# Patient Record
Sex: Male | Born: 1990 | Race: White | Hispanic: No | Marital: Single | Smoking: Current every day smoker
Health system: Southern US, Community
[De-identification: ages and names within clinical notes are randomized; demographics above are authoritative.]

## PROBLEM LIST (undated history)

## (undated) DIAGNOSIS — M419 Scoliosis, unspecified: Secondary | ICD-10-CM

## (undated) DIAGNOSIS — G40909 Epilepsy, unspecified, not intractable, without status epilepticus: Secondary | ICD-10-CM

## (undated) HISTORY — PX: BACK SURGERY: SHX140

---

## 2012-03-23 ENCOUNTER — Ambulatory Visit (INDEPENDENT_AMBULATORY_CARE_PROVIDER_SITE_OTHER): Payer: BC Managed Care – PPO | Admitting: Family Medicine

## 2012-03-23 ENCOUNTER — Encounter: Payer: Self-pay | Admitting: Family Medicine

## 2012-03-23 VITALS — BP 124/70 | HR 88 | Temp 98.0°F | Ht 60.0 in | Wt 120.0 lb

## 2012-03-23 DIAGNOSIS — R69 Illness, unspecified: Secondary | ICD-10-CM

## 2012-03-23 NOTE — Patient Instructions (Addendum)
It was nice to meet you.  Please stop by to see Nicholas Allen on your way out. 

## 2012-03-23 NOTE — Progress Notes (Signed)
  Subjective:    Patient ID: Nicholas Allen, male    DOB: 03/08/1991, 21 y.o.   MRN: [id number]  HPI  21 yo male here to establish care and talk about depression.  He is here with his aunt today.  Has not been to a physician since he was a small child.  For most of his life, he has felt depressed and anxious. He was raised by his grandparents- his mother is bipolar and schizophrenia.  He endorses difficulty sleeping. He also gets angry- usually punches things- has never hurt anyone else. Has never cut himself.  He does endorse hearing and seeing things other people do not hear or see-like voices or people.  He does not want to go into detail about these hallucinations.  He has had episodes of staying up all night.  Does not currently have any SI or HI.  There is no problem list on file for this patient.  No past medical history on file. No past surgical history on file. History  Substance Use Topics  . Smoking status: Former Games developer  . Smokeless tobacco: Not on file  . Alcohol Use: Not on file   Family History  Problem Relation Age of Onset  . Schizophrenia Mother   . Bipolar disorder Mother   . Depression Maternal Aunt    No Known Allergies No current outpatient prescriptions on file prior to visit.   The PMH, PSH, Social History, Family History, Medications, and allergies have been reviewed in [facility], and have been updated if relevant.   Review of Systems See HPI    Objective:   Physical Exam BP 124/70  Pulse 88  Temp 98 F (36.7 C)  Ht 5' (1.524 m)  Wt 120 lb (54.432 kg)  BMI 23.44 kg/m2 Gen: alert, does make eye contact, pleasant Psych:  Anxious but cooperative.    Assessment & Plan:   1. Psychiatric diagnosis deferred  Ambulatory referral to Psychiatry   >25 min spent with face to face with patient, >50% counseling and/or coordinating care. Discussed importance of psychiatry referral as he may have bipolar disorder and or schizophrenia and needs to be  evaluated by a psychiatrist. Will place referral. The patient indicates understanding of these issues and agrees with the plan.

## 2012-06-01 ENCOUNTER — Ambulatory Visit: Payer: Self-pay | Admitting: Family Medicine

## 2012-09-13 ENCOUNTER — Emergency Department: Payer: Self-pay

## 2014-10-31 ENCOUNTER — Encounter (HOSPITAL_COMMUNITY): Payer: Self-pay

## 2014-10-31 ENCOUNTER — Emergency Department (HOSPITAL_COMMUNITY)
Admission: EM | Admit: 2014-10-31 | Discharge: 2014-10-31 | Discharge status: Against Medical Advice | Payer: BLUE CROSS/BLUE SHIELD | Attending: Emergency Medicine | Admitting: Emergency Medicine

## 2014-10-31 ENCOUNTER — Emergency Department: Payer: Self-pay | Admitting: Emergency Medicine

## 2014-10-31 DIAGNOSIS — F410 Panic disorder [episodic paroxysmal anxiety] without agoraphobia: Secondary | ICD-10-CM | POA: Diagnosis not present

## 2014-10-31 DIAGNOSIS — M419 Scoliosis, unspecified: Secondary | ICD-10-CM | POA: Diagnosis not present

## 2014-10-31 HISTORY — DX: Scoliosis, unspecified: M41.9

## 2014-10-31 HISTORY — DX: Epilepsy, unspecified, not intractable, without status epilepticus: G40.909

## 2014-10-31 LAB — COMPREHENSIVE METABOLIC PANEL
ALT: 27 U/L (ref 14–63)
Albumin: 4.2 g/dL (ref 3.4–5.0)
Alkaline Phosphatase: 91 U/L (ref 46–116)
Anion Gap: 6 — ABNORMAL LOW (ref 7–16)
BILIRUBIN TOTAL: 0.5 mg/dL (ref 0.2–1.0)
BUN: 14 mg/dL (ref 7–18)
CALCIUM: 8.9 mg/dL (ref 8.5–10.1)
CHLORIDE: 106 mmol/L (ref 98–107)
CO2: 29 mmol/L (ref 21–32)
CREATININE: 0.71 mg/dL (ref 0.60–1.30)
Glucose: 90 mg/dL (ref 65–99)
Osmolality: 281 (ref 275–301)
Potassium: 3.8 mmol/L (ref 3.5–5.1)
SGOT(AST): 32 U/L (ref 15–37)
Sodium: 141 mmol/L (ref 136–145)
Total Protein: 7.1 g/dL (ref 6.4–8.2)

## 2014-10-31 LAB — CBC
HCT: 43.9 % (ref 40.0–52.0)
HGB: 14.5 g/dL (ref 13.0–18.0)
MCH: 28.5 pg (ref 26.0–34.0)
MCHC: 32.9 g/dL (ref 32.0–36.0)
MCV: 87 fL (ref 80–100)
PLATELETS: 314 10*3/uL (ref 150–440)
RBC: 5.07 10*6/uL (ref 4.40–5.90)
RDW: 13.3 % (ref 11.5–14.5)
WBC: 8.8 10*3/uL (ref 3.8–10.6)

## 2014-10-31 LAB — TROPONIN I: Troponin-I: <0.02 ng/mL

## 2014-10-31 NOTE — ED Notes (Signed)
Patient reports he has been having "black outs" that last for several seconds for several months.  He has been seen at [facility] for the same.  Patient and his girlfriend report that these episodes occur after patient becomes "really worked up" with a panic attack.  Patient is currently showing no signs of distress.  Girlfriend is sitting on lap.

## 2014-10-31 NOTE — ED Notes (Signed)
Pt called from triage, no answer 

## 2014-10-31 NOTE — ED Notes (Signed)
Pt not in lobby.  

## 2015-08-07 ENCOUNTER — Encounter: Payer: Self-pay | Admitting: Emergency Medicine

## 2015-08-07 ENCOUNTER — Emergency Department
Admission: EM | Admit: 2015-08-07 | Discharge: 2015-08-07 | Disposition: A | Discharge status: Home / Self Care | Payer: BLUE CROSS/BLUE SHIELD | Attending: Student | Admitting: Student

## 2015-08-07 DIAGNOSIS — Z720 Tobacco use: Secondary | ICD-10-CM | POA: Insufficient documentation

## 2015-08-07 DIAGNOSIS — L237 Allergic contact dermatitis due to plants, except food: Secondary | ICD-10-CM

## 2015-08-07 MED ORDER — HYDROXYZINE HCL 25 MG PO TABS: 25.0000 mg | ORAL_TABLET | Freq: Four times a day (QID) | ORAL | Status: AC | PRN

## 2015-08-07 MED ORDER — METHYLPREDNISOLONE SODIUM SUCC 125 MG IJ SOLR
125.0000 mg | Freq: Once | INTRAMUSCULAR | Status: AC
  Administered 2015-08-07: 125 mg via INTRAMUSCULAR
  Filled 2015-08-07: qty 2

## 2015-08-07 NOTE — ED Provider Notes (Signed)
[facility] Emergency Department Provider Note  ____________________________________________  Time seen: Approximately 10:10 AM  I have reviewed the triage vital signs and the nursing notes.   HISTORY  Chief Complaint Allergic Reaction   HPI Nicholas Allen is a 24 y.o. male is here with complaint of exposure to poison ivy. Patient states that he was working with a tree that "went down" in the backyard. This occurred 2 days ago and yesterday he began having a red itchy rash which quickly progressed all over his body. This morning he woke up with left eye swollen and facial swelling. Patient continues to itch. He is taken over-the-counter "allergy medication" but is not exactly sure what is in this medication. Father is unable to get this information from his wife. Patient denies any pain but states that the itching is very bad at times.He denies any vision problems other than his face being swollen.   Past Medical History  Diagnosis Date  . Epilepsy (HCC)   . Scoliosis     There are no active problems to display for this patient.   Past Surgical History  Procedure Laterality Date  . Back surgery      Current Outpatient Rx  Name  Route  Sig  Dispense  Refill  . hydrOXYzine (ATARAX/VISTARIL) 25 MG tablet   Oral   Take 1 tablet (25 mg total) by mouth every 6 (six) hours as needed.   30 tablet   0   . ibuprofen (ADVIL,MOTRIN) 200 MG tablet   Oral   Take 400 mg by mouth every 6 (six) hours as needed for moderate pain.           Allergies Review of patient's allergies indicates no known allergies.  Family History  Problem Relation Age of Onset  . Schizophrenia Mother   . Bipolar disorder Mother   . Depression Maternal Aunt     Social History Social History  Substance Use Topics  . Smoking status: Current Every Day Smoker -- 0.50 packs/day  . Smokeless tobacco: None  . Alcohol Use: Yes     Comment: socially    Review of  Systems Constitutional: No fever/chills Eyes: No visual changes. ENT: No sore throat. Cardiovascular: Denies chest pain. Respiratory: Denies shortness of breath. Gastrointestinal:  No nausea, no vomiting.  Musculoskeletal: Negative for back pain. Skin: Positive for rash Neurological: Negative for headaches, focal weakness or numbness.  10-point ROS otherwise negative.  ____________________________________________   PHYSICAL EXAM:  VITAL SIGNS: ED Triage Vitals  Enc Vitals Group     BP 08/07/15 1002 127/79 mmHg     Pulse Rate 08/07/15 1002 93     Resp 08/07/15 1002 18     Temp 08/07/15 1002 98.2 F (36.8 C)     Temp Source 08/07/15 1002 Oral     SpO2 08/07/15 1002 100 %     Weight 08/07/15 1002 120 lb (54.432 kg)     Height 08/07/15 1002  (1.549 m)     Head Cir --      Peak Flow --      Pain Score --      Pain Loc --      Pain Edu? --      Excl. in GC? --     Constitutional: Alert and oriented. Well appearing and in no acute distress. Eyes: Conjunctivae are normal. PERRL. EOMI. Head: Atraumatic. Nose: No congestion/rhinnorhea. Neck: No stridor.   Cardiovascular: Normal rate, regular rhythm. Grossly normal heart sounds.  Good peripheral circulation. Respiratory: Normal respiratory effort.  No retractions. Lungs CTAB. Gastrointestinal: Soft and nontender. No distention. Musculoskeletal: No lower extremity tenderness nor edema.  No joint effusions. Neurologic:  Normal speech and language. No gross focal neurologic deficits are appreciated. No gait instability. Skin:  Skin is warm, dry and intact. There is moderate amount of rash which consist of vesicles, erythema spread diffusely over the torso and upper extremities. Moderate erythema with vesicles left facial area. Upper lid is swollen and full view of the sclera area. Eyelid was raised and sclera are completely clear. Psychiatric: Mood and affect are normal. Speech and behavior are  normal.  ____________________________________________   LABS (all labs ordered are listed, but only abnormal results are displayed)  Labs Reviewed - No data to display  PROCEDURES  Procedure(s) performed: None  Critical Care performed: No  ____________________________________________   INITIAL IMPRESSION / ASSESSMENT AND PLAN / ED COURSE  Pertinent labs & imaging results that were available during Nicholas care of the patient were reviewed by me and considered in Nicholas medical decision making (see chart for details).  Patient was given Solu-Medrol 125 mg IM while in the emergency room. He is also given a prescription for Atarax 25 mg 1 every 6 hours when necessary itching. He was told he also could use cortisone or Benadryl cream to areas that are itching. He is to avoid scratching to prevent skin infection. ____________________________________________   FINAL CLINICAL IMPRESSION(S) / ED DIAGNOSES  Final diagnoses:  Contact dermatitis due to poison ivy      Tommi Rumpshonda L Mahad Newstrom, PA-C 08/07/15 1325  Gayla DossEryka A Gayle, MD 08/07/15 1626

## 2015-08-07 NOTE — Discharge Instructions (Signed)
Contact Dermatitis Dermatitis is redness, soreness, and swelling (inflammation) of the skin. Contact dermatitis is a reaction to certain substances that touch the skin. You either touched something that irritated your skin, or you have allergies to something you touched.  HOME CARE  Skin Care  Moisturize your skin as needed.  Apply cool compresses to the affected areas.   Try taking a bath with:   Epsom salts. Follow the instructions on the package. You can get these at a pharmacy or grocery store.   Baking soda. Pour a small amount into the bath as told by your doctor.   Colloidal oatmeal. Follow the instructions on the package. You can get this at a pharmacy or grocery store.   Try applying baking soda paste to your skin. Stir water into baking soda until it looks like paste.  Do not scratch your skin.   Bathe less often.  Bathe in lukewarm water. Avoid using hot water.  Medicines  Take or apply over-the-counter and prescription medicines only as told by your doctor.   If you were prescribed an antibiotic medicine, take or apply your antibiotic as told by your doctor. Do not stop taking the antibiotic even if your condition starts to get better. General Instructions  Keep all follow-up visits as told by your doctor. This is important.   Avoid the substance that caused your reaction. If you do not know what caused it, keep a journal to try to track what caused it. Write down:   What you eat.   What cosmetic products you use.   What you drink.   What you wear in the affected area. This includes jewelry.   If you were given a bandage (dressing), take care of it as told by your doctor. This includes when to change and remove it.  GET HELP IF:   You do not get better with treatment.   Your condition gets worse.   You have signs of infection such as:  Swelling.  Tenderness.  Redness.  Soreness.  Warmth.   You have a fever.   You have new  symptoms.  GET HELP RIGHT AWAY IF:   You have a very bad headache.  You have neck pain.  Your neck is stiff.   You throw up (vomit).   You feel very sleepy.   You see red streaks coming from the affected area.   Your bone or joint underneath the affected area becomes painful after the skin has healed.   The affected area turns darker.   You have trouble breathing.    This information is not intended to replace advice given to you by your health care provider. Make sure you discuss any questions you have with your health care provider.   Document Released: 07/11/2009 Document Revised: 06/04/2015 Document Reviewed: 01/29/2015 Elsevier Interactive Patient Education 2016 [employer]   Take Atarax as needed for itching every 6 hours.  Patient had a shot of Solu-Medrol 125 mg which should help tremendously with your poison oak. He may also use calamine lotion as needed for itching. Follow-up with your doctor if any continued problems or return to the emergency room if any urgent concerns.

## 2015-08-07 NOTE — ED Notes (Signed)
Pt here with left eye swelling that began yesterday. Pt states he was around poison ivy and thinks that might be the source of swelling. Rash noted to arms bilaterally, pt states the rash is "all over his body." Denies sob.

## 2015-12-28 ENCOUNTER — Emergency Department: Payer: No Typology Code available for payment source

## 2015-12-28 ENCOUNTER — Encounter: Payer: Self-pay | Admitting: Emergency Medicine

## 2015-12-28 ENCOUNTER — Emergency Department
Admission: EM | Admit: 2015-12-28 | Discharge: 2015-12-28 | Disposition: A | Discharge status: Home / Self Care | Payer: No Typology Code available for payment source | Attending: Student | Admitting: Student

## 2015-12-28 DIAGNOSIS — Y999 Unspecified external cause status: Secondary | ICD-10-CM | POA: Diagnosis not present

## 2015-12-28 DIAGNOSIS — Y9389 Activity, other specified: Secondary | ICD-10-CM | POA: Diagnosis not present

## 2015-12-28 DIAGNOSIS — F17200 Nicotine dependence, unspecified, uncomplicated: Secondary | ICD-10-CM | POA: Insufficient documentation

## 2015-12-28 DIAGNOSIS — Y92410 Unspecified street and highway as the place of occurrence of the external cause: Secondary | ICD-10-CM | POA: Diagnosis not present

## 2015-12-28 DIAGNOSIS — S62646A Nondisplaced fracture of proximal phalanx of right little finger, initial encounter for closed fracture: Secondary | ICD-10-CM | POA: Insufficient documentation

## 2015-12-28 DIAGNOSIS — M419 Scoliosis, unspecified: Secondary | ICD-10-CM | POA: Insufficient documentation

## 2015-12-28 DIAGNOSIS — G40909 Epilepsy, unspecified, not intractable, without status epilepticus: Secondary | ICD-10-CM | POA: Insufficient documentation

## 2015-12-28 DIAGNOSIS — S161XXA Strain of muscle, fascia and tendon at neck level, initial encounter: Secondary | ICD-10-CM | POA: Insufficient documentation

## 2015-12-28 DIAGNOSIS — S62629A Displaced fracture of medial phalanx of unspecified finger, initial encounter for closed fracture: Secondary | ICD-10-CM

## 2015-12-28 DIAGNOSIS — M79644 Pain in right finger(s): Secondary | ICD-10-CM | POA: Diagnosis present

## 2015-12-28 MED ORDER — TRAMADOL HCL 50 MG PO TABS: 50.0000 mg | ORAL_TABLET | Freq: Four times a day (QID) | ORAL | Status: DC | PRN

## 2015-12-28 MED ORDER — CYCLOBENZAPRINE HCL 5 MG PO TABS: 5.0000 mg | ORAL_TABLET | Freq: Three times a day (TID) | ORAL | Status: AC | PRN

## 2015-12-28 MED ORDER — ETODOLAC 500 MG PO TABS: 500.0000 mg | ORAL_TABLET | Freq: Two times a day (BID) | ORAL | Status: AC

## 2015-12-28 NOTE — ED Notes (Signed)
NAD noted at time of D/C. Pt denies questions or concerns. Pt ambulatory to the lobby at this time.  
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
## 2015-12-28 NOTE — Discharge Instructions (Signed)
Cervical Sprain A cervical sprain is an injury in the neck in which the strong, fibrous tissues (ligaments) that connect your neck bones stretch or tear. Cervical sprains can range from mild to severe. Severe cervical sprains can cause the neck vertebrae to be unstable. This can lead to damage of the spinal cord and can result in serious nervous system problems. The amount of time it takes for a cervical sprain to get better depends on the cause and extent of the injury. Most cervical sprains heal in 1 to 3 weeks. CAUSES  Severe cervical sprains may be caused by:   Contact sport injuries (such as from football, rugby, wrestling, hockey, auto racing, gymnastics, diving, martial arts, or boxing).   Motor vehicle collisions.   Whiplash injuries. This is an injury from a sudden forward and backward whipping movement of the head and neck.  Falls.  Mild cervical sprains may be caused by:   Being in an awkward position, such as while cradling a telephone between your ear and shoulder.   Sitting in a chair that does not offer proper support.   Working at a poorly Landscape [employer] station.   Looking up or down for long periods of time.  SYMPTOMS   Pain, soreness, stiffness, or a burning sensation in the front, back, or sides of the neck. This discomfort may develop immediately after the injury or slowly, 24 hours or more after the injury.   Pain or tenderness directly in the middle of the back of the neck.   Shoulder or upper back pain.   Limited ability to move the neck.   Headache.   Dizziness.   Weakness, numbness, or tingling in the hands or arms.   Muscle spasms.   Difficulty swallowing or chewing.   Tenderness and swelling of the neck.  DIAGNOSIS  Most of the time your health care provider can diagnose a cervical sprain by taking your history and doing a physical exam. Your health care provider will ask about previous neck injuries and any known neck  problems, such as arthritis in the neck. X-rays may be taken to find out if there are any other problems, such as with the bones of the neck. Other tests, such as a CT scan or MRI, may also be needed.  TREATMENT  Treatment depends on the severity of the cervical sprain. Mild sprains can be treated with rest, keeping the neck in place (immobilization), and pain medicines. Severe cervical sprains are immediately immobilized. Further treatment is done to help with pain, muscle spasms, and other symptoms and may include:  Medicines, such as pain relievers, numbing medicines, or muscle relaxants.   Physical therapy. This may involve stretching exercises, strengthening exercises, and posture training. Exercises and improved posture can help stabilize the neck, strengthen muscles, and help stop symptoms from returning.  HOME CARE INSTRUCTIONS   Put ice on the injured area.   Put ice in a plastic bag.   Place a towel between your skin and the bag.   Leave the ice on for 15-20 minutes, 3-4 times a day.   If your injury was severe, you may have been given a cervical collar to wear. A cervical collar is a two-piece collar designed to keep your neck from moving while it heals.  Do not remove the collar unless instructed by your health care provider.  If you have long hair, keep it outside of the collar.  Ask your health care provider before making any adjustments to your collar. Minor  adjustments may be required over time to improve comfort and reduce pressure on your chin or on the back of your head.  Ifyou are allowed to remove the collar for cleaning or bathing, follow your health care provider's instructions on how to do so safely.  Keep your collar clean by wiping it with mild soap and water and drying it completely. If the collar you have been given includes removable pads, remove them every 1-2 days and hand wash them with soap and water. Allow them to air dry. They should be completely  dry before you wear them in the collar.  If you are allowed to remove the collar for cleaning and bathing, wash and dry the skin of your neck. Check your skin for irritation or sores. If you see any, tell your health care provider.  Do not drive while wearing the collar.   Only take over-the-counter or prescription medicines for pain, discomfort, or fever as directed by your health care provider.   Keep all follow-up appointments as directed by your health care provider.   Keep all physical therapy appointments as directed by your health care provider.   Make any needed adjustments to your workstation to promote good posture.   Avoid positions and activities that make your symptoms worse.   Warm up and stretch before being active to help prevent problems.  SEEK MEDICAL CARE IF:   Your pain is not controlled with medicine.   You are unable to decrease your pain medicine over time as planned.   Your activity level is not improving as expected.  SEEK IMMEDIATE MEDICAL CARE IF:   You develop any bleeding.  You develop stomach upset.  You have signs of an allergic reaction to your medicine.   Your symptoms get worse.   You develop new, unexplained symptoms.   You have numbness, tingling, weakness, or paralysis in any part of your body.  MAKE SURE YOU:   Understand these instructions.  Will watch your condition.  Will get help right away if you are not doing well or get worse.   This information is not intended to replace advice given to you by your health care provider. Make sure you discuss any questions you have with your health care provider.   Document Released: 07/11/2007 Document Revised: 09/18/2013 Document Reviewed: 03/21/2013 Elsevier Interactive Patient Education 2016 [place].  Cryotherapy Cryotherapy means treatment with cold. Ice or gel packs can be used to reduce both pain and swelling. Ice is the most helpful within the first 24 to 48  hours after an injury or flare-up from overusing a muscle or joint. Sprains, strains, spasms, burning pain, shooting pain, and aches can all be eased with ice. Ice can also be used when recovering from surgery. Ice is effective, has very few side effects, and is safe for most people to use. PRECAUTIONS  Ice is not a safe treatment option for people with:  Raynaud phenomenon. This is a condition affecting small blood vessels in the extremities. Exposure to cold may cause your problems to return.  Cold hypersensitivity. There are many forms of cold hypersensitivity, including:  Cold urticaria. Red, itchy hives appear on the skin when the tissues begin to warm after being iced.  Cold erythema. This is a red, itchy rash caused by exposure to cold.  Cold hemoglobinuria. Red blood cells break down when the tissues begin to warm after being iced. The hemoglobin that carry oxygen are passed into the urine because they cannot combine with  blood proteins fast enough.  Numbness or altered sensitivity in the area being iced. If you have any of the following conditions, do not use ice until you have discussed cryotherapy with your caregiver:  Heart conditions, such as arrhythmia, angina, or chronic heart disease.  High blood pressure.  Healing wounds or open skin in the area being iced.  Current infections.  Rheumatoid arthritis.  Poor circulation.  Diabetes. Ice slows the blood flow in the region it is applied. This is beneficial when trying to stop inflamed tissues from spreading irritating chemicals to surrounding tissues. However, if you expose your skin to cold temperatures for too long or without the proper protection, you can damage your skin or nerves. Watch for signs of skin damage due to cold. HOME CARE INSTRUCTIONS Follow these tips to use ice and cold packs safely.  Place a dry or damp towel between the ice and skin. A damp towel will cool the skin more quickly, so you may need to  shorten the time that the ice is used.  For a more rapid response, add gentle compression to the ice.  Ice for no more than 10 to 20 minutes at a time. The bonier the area you are icing, the less time it will take to get the benefits of ice.  Check your skin after 5 minutes to make sure there are no signs of a poor response to cold or skin damage.  Rest 20 minutes or more between uses.  Once your skin is numb, you can end your treatment. You can test numbness by very lightly touching your skin. The touch should be so light that you do not see the skin dimple from the pressure of your fingertip. When using ice, most people will feel these normal sensations in this order: cold, burning, aching, and numbness.  Do not use ice on someone who cannot communicate their responses to pain, such as small children or people with dementia. HOW TO MAKE AN ICE PACK Ice packs are the most common way to use ice therapy. Other methods include ice massage, ice baths, and cryosprays. Muscle creams that cause a cold, tingly feeling do not offer the same benefits that ice offers and should not be used as a substitute unless recommended by your caregiver. To make an ice pack, do one of the following:  Place crushed ice or a bag of frozen vegetables in a sealable plastic bag. Squeeze out the excess air. Place this bag inside another plastic bag. Slide the bag into a pillowcase or place a damp towel between your skin and the bag.  Mix 3 parts water with 1 part rubbing alcohol. Freeze the mixture in a sealable plastic bag. When you remove the mixture from the freezer, it will be slushy. Squeeze out the excess air. Place this bag inside another plastic bag. Slide the bag into a pillowcase or place a damp towel between your skin and the bag. SEEK MEDICAL CARE IF:  You develop white spots on your skin. This may give the skin a blotchy (mottled) appearance.  Your skin turns blue or pale.  Your skin becomes waxy or  hard.  Your swelling gets worse. MAKE SURE YOU:   Understand these instructions.  Will watch your condition.  Will get help right away if you are not doing well or get worse.   This information is not intended to replace advice given to you by your health care provider. Make sure you discuss any questions you have with  your health care provider.   Document Released: 05/10/2011 Document Revised: 10/04/2014 Document Reviewed: 05/10/2011 Elsevier Interactive Patient Education 2016 Else[employer] Inc[employer]  Finger Fracture Finger fractures are breaks in the bones of the fingers. There are many types of fractures. There are also different ways of treating these fractures. Your doctor will talk with you about the best way to treat your fracture. Injury is the main cause of broken fingers. This includes:  Injuries while playing sports.  Workplace injuries.  Falls. HOME CARE  Follow your doctor's instructions for:  Activities.  Exercises.  Physical therapy.  Take medicines only as told by your doctor for pain, discomfort, or fever. GET HELP IF: You have pain or swelling that limits:  The motion of your fingers.  The use of your fingers. GET HELP RIGHT AWAY IF:  You cannot feel your fingers, or your fingers become numb.   This information is not intended to replace advice given to you by your health care provider. Make sure you discuss any questions you have with your health care provider.   Document Released: 03/01/2008 Document Revised: 10/04/2014 Document Reviewed: 04/25/2013 Elsevier Interactive Patient Education [employer] Else[employer] Inc[employer]

## 2017-11-30 ENCOUNTER — Emergency Department
Admission: EM | Admit: 2017-11-30 | Discharge: 2017-11-30 | Disposition: A | Discharge status: Home / Self Care | Payer: 59 | Attending: Emergency Medicine | Admitting: Emergency Medicine

## 2017-11-30 ENCOUNTER — Encounter: Payer: Self-pay | Admitting: *Deleted

## 2017-11-30 ENCOUNTER — Other Ambulatory Visit: Payer: Self-pay

## 2017-11-30 DIAGNOSIS — G43909 Migraine, unspecified, not intractable, without status migrainosus: Secondary | ICD-10-CM | POA: Diagnosis not present

## 2017-11-30 DIAGNOSIS — Z79899 Other long term (current) drug therapy: Secondary | ICD-10-CM | POA: Diagnosis not present

## 2017-11-30 DIAGNOSIS — R531 Weakness: Secondary | ICD-10-CM | POA: Insufficient documentation

## 2017-11-30 DIAGNOSIS — R5383 Other fatigue: Secondary | ICD-10-CM | POA: Insufficient documentation

## 2017-11-30 DIAGNOSIS — R51 Headache: Secondary | ICD-10-CM | POA: Diagnosis present

## 2017-11-30 DIAGNOSIS — F17200 Nicotine dependence, unspecified, uncomplicated: Secondary | ICD-10-CM | POA: Insufficient documentation

## 2017-11-30 LAB — COMPREHENSIVE METABOLIC PANEL
ALBUMIN: 4.3 g/dL (ref 3.5–5.0)
ALK PHOS: 90 U/L (ref 38–126)
ALT: 22 U/L (ref 17–63)
ANION GAP: 9 (ref 5–15)
AST: 37 U/L (ref 15–41)
BILIRUBIN TOTAL: 0.6 mg/dL (ref 0.3–1.2)
BUN: 10 mg/dL (ref 6–20)
CO2: 26 mmol/L (ref 22–32)
Calcium: 9 mg/dL (ref 8.9–10.3)
Chloride: 104 mmol/L (ref 101–111)
Creatinine, Ser: 0.59 mg/dL — ABNORMAL LOW (ref 0.61–1.24)
GFR calc non Af Amer: >60 mL/min (ref >60)
Glucose, Bld: 90 mg/dL (ref 65–99)
POTASSIUM: 3.9 mmol/L (ref 3.5–5.1)
Sodium: 139 mmol/L (ref 135–145)
TOTAL PROTEIN: 7.5 g/dL (ref 6.5–8.1)

## 2017-11-30 LAB — CBC
HCT: 42.2 % (ref 40.0–52.0)
HEMOGLOBIN: 14.9 g/dL (ref 13.0–18.0)
MCH: 30.2 pg (ref 26.0–34.0)
MCHC: 35.2 g/dL (ref 32.0–36.0)
MCV: 85.6 fL (ref 80.0–100.0)
Platelets: 362 10*3/uL (ref 150–440)
RBC: 4.93 MIL/uL (ref 4.40–5.90)
RDW: 13.5 % (ref 11.5–14.5)
WBC: 7.8 10*3/uL (ref 3.8–10.6)

## 2017-11-30 LAB — TROPONIN I

## 2017-11-30 MED ORDER — METOCLOPRAMIDE HCL 5 MG/ML IJ SOLN
10.0000 mg | Freq: Once | INTRAMUSCULAR | Status: AC
  Administered 2017-11-30: 10 mg via INTRAVENOUS
  Filled 2017-11-30: qty 2

## 2017-11-30 MED ORDER — SODIUM CHLORIDE 0.9 % IV BOLUS (SEPSIS)
1000.0000 mL | Freq: Once | INTRAVENOUS | Status: AC
  Administered 2017-11-30: 1000 mL via INTRAVENOUS

## 2017-11-30 MED ORDER — KETOROLAC TROMETHAMINE 30 MG/ML IJ SOLN
30.0000 mg | Freq: Once | INTRAMUSCULAR | Status: AC
  Administered 2017-11-30: 30 mg via INTRAVENOUS
  Filled 2017-11-30: qty 1

## 2017-11-30 MED ORDER — ONDANSETRON HCL 4 MG/2ML IJ SOLN
4.0000 mg | Freq: Once | INTRAMUSCULAR | Status: AC
  Administered 2017-11-30: 4 mg via INTRAVENOUS
  Filled 2017-11-30: qty 2

## 2017-11-30 NOTE — ED Provider Notes (Addendum)
[facility] Emergency Department Provider Note  Time seen: 8:21 PM  I have reviewed the triage vital signs and the nursing notes.   HISTORY  Chief Complaint Headache    HPI Nicholas Allen is a 27 y.o. male with a past medical history of migraines who presents to the emergency department for generalized fatigue/weakness as well as a headache.  According to the patient for the past 1-1.5 weeks he has been experiencing generalized fatigue weakness and a fairly constant moderate headache which he describes as a 5/10 dull aching headache.  Patient states a history of migraine headaches although he is not been formally diagnosed.  Denies any chest pain abdominal pain fever cough congestion nausea vomiting or diarrhea.  Denies dysuria.  Patient states he just feels fatigued tired and a generalized sensation of weakness.   Past Medical History:  Diagnosis Date  . Epilepsy (HCC)   . Scoliosis     There are no active problems to display for this patient.   Past Surgical History:  Procedure Laterality Date  . BACK SURGERY      Prior to Admission medications   Medication Sig Start Date End Date Taking? Authorizing Provider  cyclobenzaprine (FLEXERIL) 5 MG tablet Take 1 tablet (5 mg total) by mouth every 8 (eight) hours as needed for muscle spasms. 12/28/15   Evon Slack, PA-C  etodolac (LODINE) 500 MG tablet Take 1 tablet (500 mg total) by mouth 2 (two) times daily. 12/28/15   Evon Slack, PA-C  hydrOXYzine (ATARAX/VISTARIL) 25 MG tablet Take 1 tablet (25 mg total) by mouth every 6 (six) hours as needed. 08/07/15   Tommi Rumps, PA-C  ibuprofen (ADVIL,MOTRIN) 200 MG tablet Take 400 mg by mouth every 6 (six) hours as needed for moderate pain.    [provider]  traMADol (ULTRAM) 50 MG tablet Take 1 tablet (50 mg total) by mouth every 6 (six) hours as needed. 12/28/15   Evon Slack, PA-C    No Known Allergies  Family History  Problem  Relation Age of Onset  . Schizophrenia Mother   . Bipolar disorder Mother   . Depression Maternal Aunt     Social History Social History   Tobacco Use  . Smoking status: Current Every Day Smoker    Packs/day: 1.00  . Smokeless tobacco: Never Used  Substance Use Topics  . Alcohol use: Yes    Comment: socially  . Drug use: No    Review of Systems Constitutional: Negative for fever.  Positive for generalized fatigue/weakness Eyes: Negative for visual complaints ENT: Negative for recent illness/congestion Cardiovascular: Negative for chest pain. Respiratory: Negative for shortness of breath. Gastrointestinal: Negative for abdominal pain, vomiting and diarrhea. Genitourinary: Negative for urinary compaints Musculoskeletal: Negative for musculoskeletal complaints Skin: Negative for skin complaints  Neurological: Moderate headache.  Denies focal weakness or numbness. All other ROS negative  ____________________________________________   PHYSICAL EXAM:  VITAL SIGNS: ED Triage Vitals  Enc Vitals Group     BP 11/30/17 1920 138/87     Pulse Rate 11/30/17 1920 95     Resp 11/30/17 1920 20     Temp 11/30/17 1920 98.7 F (37.1 C)     Temp Source 11/30/17 1920 Oral     SpO2 11/30/17 1920 98 %     Weight 11/30/17 1921 130 lb (59 kg)     Height 11/30/17 1921 5\' 1"  (1.549 m)     Head Circumference --  Peak Flow --      Pain Score 11/30/17 1921 5     Pain Loc --      Pain Edu? --      Excl. in [place]? --    Constitutional: Alert and oriented. Well appearing and in no distress. Eyes: Normal exam, mild photophobia ENT   Head: Normocephalic and atraumatic.   Mouth/Throat: Mucous membranes are moist. Cardiovascular: Normal rate, regular rhythm. No murmur Respiratory: Normal respiratory effort without tachypnea nor retractions. Breath sounds are clear  Gastrointestinal: Soft and nontender. No distention.  Musculoskeletal: Nontender with normal range of motion in all  extremities. Neurologic:  Normal speech and language. No gross focal neurologic deficits.  Equal grip strength.  No pronator drift.  5/5 motor.  Cranial nerves intact. Skin:  Skin is warm, dry and intact.  Psychiatric: Mood and affect are normal.   ____________________________________________   INITIAL IMPRESSION / ASSESSMENT AND PLAN / ED COURSE  Pertinent labs & imaging results that were available during my care of the patient were reviewed by me and considered in my medical decision making (see chart for details).  She presents to the emergency department for generalized fatigue/weakness and a moderate headache.  Differential would include migraine, electrolyte/metabolic abnormality, dehydration, viral illness.  We will check labs, treat the patient's headache, IV hydrate and closely monitor.  Patient agreeable to this plan of care.  Labs are normal.  Patient states he feels much better after medications.  We will discharge home.  I discussed plenty of sleep, plenty of fluids and PCP follow-up.  ____________________________________________   FINAL CLINICAL IMPRESSION(S) / ED DIAGNOSES  Generalized fatigue/weakness Migraine headache    Minna AntisPaduchowski, Bracy Pepper, MD 11/30/17 2024    Minna AntisPaduchowski, Toriann Spadoni, MD 11/30/17 2125

## 2017-11-30 NOTE — ED Triage Notes (Signed)
Pt reports dizziness for 1.5 weeks. Pt reports a headache and states it feels like a migraine.  Pt alert.  Speech clear.  No n/v/d.

## 2019-09-30 ENCOUNTER — Emergency Department
Admission: EM | Admit: 2019-09-30 | Discharge: 2019-09-30 | Disposition: A | Discharge status: Home / Self Care | Payer: 59 | Attending: Emergency Medicine | Admitting: Emergency Medicine

## 2019-09-30 ENCOUNTER — Other Ambulatory Visit: Payer: Self-pay

## 2019-09-30 DIAGNOSIS — Z79899 Other long term (current) drug therapy: Secondary | ICD-10-CM | POA: Insufficient documentation

## 2019-09-30 DIAGNOSIS — F17210 Nicotine dependence, cigarettes, uncomplicated: Secondary | ICD-10-CM | POA: Insufficient documentation

## 2019-09-30 DIAGNOSIS — L03012 Cellulitis of left finger: Secondary | ICD-10-CM

## 2019-09-30 NOTE — Discharge Instructions (Signed)
You have had your cuticle abscess opened and drained. Keep the wound clean, dry, and covered. Follow-up with Dr. Dayton Martes as needed.

## 2019-09-30 NOTE — ED Notes (Signed)
Pt in with thumb pain - states he tried to pull a hang nail and now swollen and red with a little bruising as well.

## 2019-09-30 NOTE — ED Triage Notes (Signed)
Pt states he woke up with pain and swelling to the left thumb yesterday and it is worsening. Denies injury, swelling at the cuticle

## 2019-09-30 NOTE — ED Provider Notes (Signed)
[facility] Emergency Department Provider Note ____________________________________________  Time seen: 1140  I have reviewed the triage vital signs and the nursing notes.  HISTORY  Chief Complaint  Hand Pain (thumb)  HPI Nicholas Allen is a 29 y.o. male presents himself to the ED for evaluation of acute pain and swelling to the left thumb.  Patient describes pulling the cuticle hangnail away, before he began to experience swelling and pus collection to the cuticle.  He denies any other injury at this time.  Denies any fevers, chills, or sweats.   Past Medical History:  Diagnosis Date  . Epilepsy ([place])   . Scoliosis     There are no problems to display for this patient.   Past Surgical History:  Procedure Laterality Date  . BACK SURGERY      Prior to Admission medications   Medication Sig Start Date End Date Taking? Authorizing Provider  cyclobenzaprine (FLEXERIL) 5 MG tablet Take 1 tablet (5 mg total) by mouth every 8 (eight) hours as needed for muscle spasms. 12/28/15   Duanne Guess, PA-C  etodolac (LODINE) 500 MG tablet Take 1 tablet (500 mg total) by mouth 2 (two) times daily. 12/28/15   Duanne Guess, PA-C  hydrOXYzine (ATARAX/VISTARIL) 25 MG tablet Take 1 tablet (25 mg total) by mouth every 6 (six) hours as needed. 08/07/15   Johnn Hai, PA-C  ibuprofen (ADVIL,MOTRIN) 200 MG tablet Take 400 mg by mouth every 6 (six) hours as needed for moderate pain.    [provider]  traMADol (ULTRAM) 50 MG tablet Take 1 tablet (50 mg total) by mouth every 6 (six) hours as needed. 12/28/15   Duanne Guess, PA-C    Allergies Patient has no known allergies.  Family History  Problem Relation Age of Onset  . Schizophrenia Mother   . Bipolar disorder Mother   . Depression Maternal Aunt     Social History Social History   Tobacco Use  . Smoking status: Current Every Day Smoker    Packs/day: 1.00  . Smokeless tobacco: Never Used   Substance Use Topics  . Alcohol use: Yes    Comment: socially  . Drug use: No    Review of Systems  Constitutional: Negative for fever. Cardiovascular: Negative for chest pain. Respiratory: Negative for shortness of breath. Musculoskeletal: Negative for back pain. Skin: Negative for rash.  Infected cuticle of the left thumb as above. Neurological: Negative for headaches, focal weakness or numbness. ____________________________________________  PHYSICAL EXAM:  VITAL SIGNS: ED Triage Vitals  Enc Vitals Group     BP 09/30/19 1009 137/79     Pulse Rate 09/30/19 1009 78     Resp 09/30/19 1009 16     Temp 09/30/19 1009 97.9 F (36.6 C)     Temp Source 09/30/19 1009 Oral     SpO2 09/30/19 1009 99 %     Weight 09/30/19 1011 145 lb (65.8 kg)     Height 09/30/19 1011 5\' 1"  (1.549 m)     Head Circumference --      Peak Flow --      Pain Score 09/30/19 1010 5     Pain Loc --      Pain Edu? --      Excl. in [place]? --     Constitutional: Alert and oriented. Well appearing and in no distress. Head: Normocephalic and atraumatic. Eyes: Conjunctivae are normal. Normal extraocular movements Cardiovascular: Normal rate, regular rhythm. Normal distal pulses. Respiratory: Normal  respiratory effort. Musculoskeletal: Nontender with normal range of motion in all extremities.  Neurologic:  Normal gross sensation. Normal speech and language. No gross focal neurologic deficits are appreciated. Skin:  Skin is warm, dry and intact. No rash noted. Left thumb with a local collection of pus at the medial cuticle ____________________________________________  PROCEDURES  .Marland KitchenIncision and Drainage  Date/Time: 09/30/2019 11:35 AM Performed by: Lissa Hoard, PA-C Authorized by: Lissa Hoard, PA-C   Consent:    Consent obtained:  Verbal   Consent given by:  Patient   Risks discussed:  Bleeding and pain Location:    Indications for incision and drainage: paronychia.   Location:   Upper extremity   Upper extremity location:  Finger   Finger location:  L thumb Pre-procedure details:    Procedure prep: alcohol. Anesthesia (see MAR for exact dosages):    Anesthesia method:  None Procedure type:    Complexity:  Simple Procedure details:    Incision types:  Stab incision   Incision depth:  Dermal   Scalpel size: 18G needle bevel.   Wound management:  Irrigated with saline   Drainage:  Purulent   Drainage amount:  Moderate   Packing materials:  None Post-procedure details:    Patient tolerance of procedure:  Tolerated well, no immediate complications   ____________________________________________  INITIAL IMPRESSION / ASSESSMENT AND PLAN / ED COURSE  Patient with ED evaluation of a paronychia develop to the left thumb.  He presents due to pain and swelling.  The local abscess formation is opened and drained using 18-gauge needle bevel.  Patient tolerated procedure well and his wound is covered with a sterile, disposable bandage.  Wound care directions are provided.  Patient will follow with primary provider return to the ED as needed.  Nicholas Allen was evaluated in Emergency Department on 09/30/2019 for the symptoms described in the history of present illness. He was evaluated in the context of the global COVID-19 pandemic, which necessitated consideration that the patient might be at risk for infection with the SARS-CoV-2 virus that causes COVID-19. Institutional protocols and algorithms that pertain to the evaluation of patients at risk for COVID-19 are in a state of rapid change based on information released by regulatory bodies including the CDC and federal and state organizations. These policies and algorithms were followed during the patient's care in the ED. ____________________________________________  FINAL CLINICAL IMPRESSION(S) / ED DIAGNOSES  Final diagnoses:  Paronychia of left thumb      Karmen Stabs, Charlesetta Ivory, PA-C 09/30/19 1147    Dionne Bucy, MD 09/30/19 1314

## 2020-03-19 ENCOUNTER — Telehealth: Payer: Self-pay | Admitting: General Practice

## 2020-03-19 NOTE — Telephone Encounter (Signed)
Removed Dr. Aron as patient's PCP due to her leaving this practice and patient has not been seen by her in 5 years.  

## 2020-04-27 ENCOUNTER — Encounter: Payer: Self-pay | Admitting: Emergency Medicine

## 2020-04-27 ENCOUNTER — Emergency Department: Payer: Self-pay

## 2020-04-27 ENCOUNTER — Emergency Department
Admission: EM | Admit: 2020-04-27 | Discharge: 2020-04-27 | Disposition: A | Discharge status: Home / Self Care | Payer: Self-pay | Attending: Emergency Medicine | Admitting: Emergency Medicine

## 2020-04-27 ENCOUNTER — Other Ambulatory Visit: Payer: Self-pay

## 2020-04-27 DIAGNOSIS — F17200 Nicotine dependence, unspecified, uncomplicated: Secondary | ICD-10-CM | POA: Insufficient documentation

## 2020-04-27 DIAGNOSIS — M5441 Lumbago with sciatica, right side: Secondary | ICD-10-CM | POA: Insufficient documentation

## 2020-04-27 MED ORDER — KETOROLAC TROMETHAMINE 30 MG/ML IJ SOLN
30.0000 mg | Freq: Once | INTRAMUSCULAR | Status: AC
  Administered 2020-04-27: 30 mg via INTRAMUSCULAR
  Filled 2020-04-27: qty 1

## 2020-04-27 MED ORDER — BACLOFEN 5 MG PO TABS: 5.0000 mg | ORAL_TABLET | Freq: Three times a day (TID) | ORAL | 0 refills | Status: AC | PRN

## 2020-04-27 MED ORDER — IBUPROFEN 600 MG PO TABS: 600.0000 mg | ORAL_TABLET | Freq: Four times a day (QID) | ORAL | 0 refills | Status: DC | PRN

## 2020-04-27 MED ORDER — LIDOCAINE 5 % EX PTCH
1.0000 | MEDICATED_PATCH | CUTANEOUS | Status: DC
  Administered 2020-04-27: 1 via TRANSDERMAL
  Filled 2020-04-27: qty 1

## 2020-04-27 MED ORDER — LIDOCAINE 5 % EX PTCH: 1.0000 | MEDICATED_PATCH | CUTANEOUS | 0 refills | Status: AC

## 2020-04-27 NOTE — ED Notes (Signed)
See this RN triage note. Pt c/o hx of back problems and back pain. States pain to lower back and could not stand the pain today. Pt ambulatory from triage wait to treatment room.

## 2020-04-27 NOTE — ED Triage Notes (Signed)
Pt presents to ED ambulatory with steady gait, pt c/o lower back pain, pt states hx of back problems but today the pain was unbearable. Pt A&O x4, ambulatory without difficulty.

## 2020-04-27 NOTE — ED Provider Notes (Signed)
[facility] Emergency Department Provider Note  ____________________________________________  Time seen: Approximately 6:16 PM  I have reviewed the triage vital signs and the nursing notes.   HISTORY  Chief Complaint Back Pain    HPI Nicholas Allen is a 29 y.o. male that presents to the emergency department for evaluation of acute on chronic low back pain.  Patient states that pain radiates down the back of his right leg occasionally.  Pain has been worse than usual recently.  He does lift heavy items.  No specific trauma.  No bowel or bladder dysfunction or saddle anesthesias.  He has taken Tylenol for pain.  No fever, abdominal pain, numbness, tingling, urinary symptoms.  Past Medical History:  Diagnosis Date  . Epilepsy (HCC)   . Scoliosis     There are no problems to display for this patient.   Past Surgical History:  Procedure Laterality Date  . BACK SURGERY      Prior to Admission medications   Medication Sig Start Date End Date Taking? Authorizing Provider  Baclofen 5 MG TABS Take 5 mg by mouth 3 (three) times daily as needed. 04/27/20   Enid Derry, PA-C  cyclobenzaprine (FLEXERIL) 5 MG tablet Take 1 tablet (5 mg total) by mouth every 8 (eight) hours as needed for muscle spasms. 12/28/15   Evon Slack, PA-C  etodolac (LODINE) 500 MG tablet Take 1 tablet (500 mg total) by mouth 2 (two) times daily. 12/28/15   Evon Slack, PA-C  hydrOXYzine (ATARAX/VISTARIL) 25 MG tablet Take 1 tablet (25 mg total) by mouth every 6 (six) hours as needed. 08/07/15   Tommi Rumps, PA-C  ibuprofen (ADVIL) 600 MG tablet Take 1 tablet (600 mg total) by mouth every 6 (six) hours as needed. 04/27/20   Enid Derry, PA-C  lidocaine (LIDODERM) 5 % Place 1 patch onto the skin daily. Remove & Discard patch within 12 hours or as directed by MD 04/27/20   Enid Derry, PA-C  traMADol (ULTRAM) 50 MG tablet Take 1 tablet (50 mg total) by mouth every 6 (six) hours as  needed. 12/28/15   Evon Slack, PA-C    Allergies Patient has no known allergies.  Family History  Problem Relation Age of Onset  . Schizophrenia Mother   . Bipolar disorder Mother   . Depression Maternal Aunt     Social History Social History   Tobacco Use  . Smoking status: Current Every Day Smoker    Packs/day: 1.00  . Smokeless tobacco: Never Used  Substance Use Topics  . Alcohol use: Yes    Comment: socially  . Drug use: No     Review of Systems  Constitutional: No fever/chills Cardiovascular: No chest pain. Respiratory: No SOB. Gastrointestinal: No abdominal pain.  No nausea, no vomiting.  Musculoskeletal: Positive for back pain. Skin: Negative for rash, abrasions, lacerations, ecchymosis. Neurological: Negative for headaches, numbness or tingling   ____________________________________________   PHYSICAL EXAM:  VITAL SIGNS: ED Triage Vitals  Enc Vitals Group     BP 04/27/20 1616 (!) 132/73     Pulse Rate 04/27/20 1616 84     Resp 04/27/20 1616 20     Temp 04/27/20 1616 98.1 F (36.7 C)     Temp Source 04/27/20 1616 Oral     SpO2 04/27/20 1616 98 %     Weight 04/27/20 1617 128 lb (58.1 kg)     Height 04/27/20 1617 5\' 1"  (1.549 m)     Head Circumference --  Peak Flow --      Pain Score 04/27/20 1616 8     Pain Loc --      Pain Edu? --      Excl. in [place]? --      Constitutional: Alert and oriented. Well appearing and in no acute distress. Eyes: Conjunctivae are normal. PERRL. EOMI. Head: Atraumatic. ENT:      Ears:      Nose: No congestion/rhinnorhea.      Mouth/Throat: Mucous membranes are moist.  Neck: No stridor.  Cardiovascular: Normal rate, regular rhythm.  Good peripheral circulation. Respiratory: Normal respiratory effort without tachypnea or retractions. Lungs CTAB. Good air entry to the bases with no decreased or absent breath sounds. Gastrointestinal: Bowel sounds 4 quadrants. Soft and nontender to palpation. No guarding or  rigidity. No palpable masses. No distention.  Musculoskeletal: Full range of motion to all extremities. No gross deformities appreciated.  Tenderness to palpation throughout lumbar spine and right lumbar paraspinal muscles.  Strength equal in lower extremities bilaterally.  Normal gait and quick gait. Neurologic:  Normal speech and language. No gross focal neurologic deficits are appreciated.  Skin:  Skin is warm, dry and intact. No rash noted. Psychiatric: Mood and affect are normal. Speech and behavior are normal. Patient exhibits appropriate insight and judgement.   ____________________________________________   LABS (all labs ordered are listed, but only abnormal results are displayed)  Labs Reviewed - No data to display ____________________________________________  EKG   ____________________________________________  RADIOLOGY Lexine Baton, personally viewed and evaluated these images (plain radiographs) as part of my medical decision making, as well as reviewing the written report by the radiologist.  DG Lumbar Spine 2-3 Views  Result Date: 04/27/2020 CLINICAL DATA:  Back pain worsening today, history of surgery for scoliosis EXAM: LUMBAR SPINE - 2-3 VIEW COMPARISON:  Thoracic radiographs 09/13/2012 FINDINGS: Long segment thoracolumbar fusion hardware incompletely included within the level of imaging demonstrating fusion from the thoracic levels through L2 including bilateral transpedicular screws at L2 and a left transpedicular screw L1. No evidence of acute hardware complication or failure. Mild residual levocurvature of the thoracic levels. No acute fracture or vertebral body height loss. Normal bone mineralization. No worrisome osseous lesions. Included portions of bony pelvis are free of acute abnormality. IMPRESSION: 1. Long segment thoracolumbar fusion hardware without evidence of acute hardware complication or failure. 2. Mild residual levocurvature of the thoracic levels. 3.  No acute osseous abnormality. Electronically Signed   By: Kreg Shropshire M.D.   On: 04/27/2020 18:40    ____________________________________________    PROCEDURES  Procedure(s) performed:    Procedures    Medications  lidocaine (LIDODERM) 5 % 1 patch (1 patch Transdermal Patch Applied 04/27/20 1910)  ketorolac (TORADOL) 30 MG/ML injection 30 mg (30 mg Intramuscular Given 04/27/20 1911)     ____________________________________________   INITIAL IMPRESSION / ASSESSMENT AND PLAN / ED COURSE  Pertinent labs & imaging results that were available during my care of the patient were reviewed by me and considered in my medical decision making (see chart for details).  Review of the [place] CSRS was performed in accordance of the [facility] prior to dispensing any controlled drugs.   Patient presented to the emergency department for evaluation of low back pain.  Vital signs and exam are reassuring.  X-ray negative for acute bony abnormalities.  Patient will be discharged home with prescriptions for baclofen, motrin, lidoderm. Patient is to follow up with PCP or ortho as directed. Patient is given ED  precautions to return to the ED for any worsening or new symptoms.   Nicholas Allen was evaluated in Emergency Department on 04/27/2020 for the symptoms described in the history of present illness. He was evaluated in the context of the global COVID-19 pandemic, which necessitated consideration that the patient might be at risk for infection with the SARS-CoV-2 virus that causes COVID-19. Institutional protocols and algorithms that pertain to the evaluation of patients at risk for COVID-19 are in a state of rapid change based on information released by regulatory bodies including the CDC and federal and state organizations. These policies and algorithms were followed during the patient's care in the ED.  ____________________________________________  FINAL CLINICAL IMPRESSION(S) / ED DIAGNOSES  Final  diagnoses:  Acute right-sided low back pain with right-sided sciatica      NEW MEDICATIONS STARTED DURING THIS VISIT:  ED Discharge Orders         Ordered    ibuprofen (ADVIL) 600 MG tablet  Every 6 hours PRN     Discontinue  Reprint     04/27/20 1926    Baclofen 5 MG TABS  3 times daily PRN     Discontinue  Reprint     04/27/20 1926    lidocaine (LIDODERM) 5 %  Every 24 hours     Discontinue  Reprint     04/27/20 1926              This chart was dictated using voice recognition software/Dragon. Despite best efforts to proofread, errors can occur which can change the meaning. Any change was purely unintentional.    Enid Derry, PA-C 04/27/20 1941    Phineas Semen, MD 04/27/20 2036

## 2020-05-23 ENCOUNTER — Other Ambulatory Visit: Payer: Self-pay

## 2020-05-23 DIAGNOSIS — Z20822 Contact with and (suspected) exposure to covid-19: Secondary | ICD-10-CM

## 2020-05-25 LAB — NOVEL CORONAVIRUS, NAA: SARS-CoV-2, NAA: NOT DETECTED

## 2020-05-25 LAB — SARS-COV-2, NAA 2 DAY TAT

## 2021-03-16 ENCOUNTER — Other Ambulatory Visit: Payer: Self-pay

## 2021-03-16 ENCOUNTER — Encounter: Payer: Self-pay | Admitting: Emergency Medicine

## 2021-03-16 ENCOUNTER — Emergency Department
Admission: EM | Admit: 2021-03-16 | Discharge: 2021-03-16 | Disposition: A | Discharge status: Home / Self Care | Payer: Self-pay | Attending: Emergency Medicine | Admitting: Emergency Medicine

## 2021-03-16 DIAGNOSIS — L03116 Cellulitis of left lower limb: Secondary | ICD-10-CM | POA: Insufficient documentation

## 2021-03-16 DIAGNOSIS — F17210 Nicotine dependence, cigarettes, uncomplicated: Secondary | ICD-10-CM | POA: Insufficient documentation

## 2021-03-16 MED ORDER — CEPHALEXIN 500 MG PO CAPS
500.0000 mg | ORAL_CAPSULE | Freq: Two times a day (BID) | ORAL | 0 refills | Status: AC
Start: 2021-03-16 — End: ?

## 2021-03-16 NOTE — ED Triage Notes (Signed)
Pt reports has an insect bite on his left ankle. Pt reports area has become a little swollen and is now draining something and he is concerned it is infected.

## 2021-03-16 NOTE — ED Provider Notes (Signed)
[facility] Emergency Department Provider Note   ____________________________________________    I have reviewed the triage vital signs and the nursing notes.   HISTORY  Chief Complaint Abscess and Ankle Pain     HPI Nicholas Allen is a 30 y.o. male with history as noted below who presents with complaints of small area of redness to the left ankle.  He thinks he may have been bitten by an insect but he is not sure.  He reports he was working on the yard.  Denies fevers or chills, otherwise feeling well  Past Medical History:  Diagnosis Date   Epilepsy (HCC)    Scoliosis     There are no problems to display for this patient.   Past Surgical History:  Procedure Laterality Date   BACK SURGERY      Prior to Admission medications   Medication Sig Start Date End Date Taking? Authorizing Provider  cephALEXin (KEFLEX) 500 MG capsule Take 1 capsule (500 mg total) by mouth 2 (two) times daily. 03/16/21  Yes Jene Every, MD  Baclofen 5 MG TABS Take 5 mg by mouth 3 (three) times daily as needed. 04/27/20   Enid Derry, PA-C  cyclobenzaprine (FLEXERIL) 5 MG tablet Take 1 tablet (5 mg total) by mouth every 8 (eight) hours as needed for muscle spasms. 12/28/15   Evon Slack, PA-C  etodolac (LODINE) 500 MG tablet Take 1 tablet (500 mg total) by mouth 2 (two) times daily. 12/28/15   Evon Slack, PA-C  hydrOXYzine (ATARAX/VISTARIL) 25 MG tablet Take 1 tablet (25 mg total) by mouth every 6 (six) hours as needed. 08/07/15   Tommi Rumps, PA-C  ibuprofen (ADVIL) 600 MG tablet Take 1 tablet (600 mg total) by mouth every 6 (six) hours as needed. 04/27/20   Enid Derry, PA-C  lidocaine (LIDODERM) 5 % Place 1 patch onto the skin daily. Remove & Discard patch within 12 hours or as directed by MD 04/27/20   Enid Derry, PA-C  traMADol (ULTRAM) 50 MG tablet Take 1 tablet (50 mg total) by mouth every 6 (six) hours as needed. 12/28/15   Evon Slack, PA-C      Allergies Patient has no known allergies.  Family History  Problem Relation Age of Onset   Schizophrenia Mother    Bipolar disorder Mother    Depression Maternal Aunt     Social History Social History   Tobacco Use   Smoking status: Every Day    Packs/day: 1.00    Pack years: 0.00    Types: Cigarettes   Smokeless tobacco: Never  Substance Use Topics   Alcohol use: Yes    Comment: socially   Drug use: No    Review of Systems  Constitutional: No fever/chills     Musculoskeletal: No pain Skin: As above     ____________________________________________   PHYSICAL EXAM:  VITAL SIGNS: ED Triage Vitals  Enc Vitals Group     BP 03/16/21 1058 131/82     Pulse Rate 03/16/21 1058 83     Resp 03/16/21 1058 12     Temp 03/16/21 1058 97.7 F (36.5 C)     Temp Source 03/16/21 1058 Oral     SpO2 03/16/21 1058 100 %     Weight 03/16/21 1053 59 kg (130 lb)     Height 03/16/21 1053 1.549 m (5\' 1" )     Head Circumference --      Peak Flow --  Pain Score 03/16/21 1053 0     Pain Loc --      Pain Edu? --      Excl. in GC? --      Constitutional: Alert and oriented. No acute distress. Pleasant and interactive Eyes: Conjunctivae are normal.  Head: Atraumatic. Nose: No congestion/rhinnorhea. Mouth/Throat: Mucous membranes are moist.   Cardiovascular: Normal rate, regular rhythm.  Respiratory: Normal respiratory effort.  No retractions. Genitourinary: deferred Musculoskeletal: No lower extremity tenderness nor edema.   Neurologic:  Normal speech and language. No gross focal neurologic deficits are appreciated.   Skin:  Skin is warm, dry.  Small abrasion to the left lateral ankle with small area of erythema surrounding consistent with a mild cellulitis, no fluctuance or drainage   ____________________________________________   LABS (all labs ordered are listed, but only abnormal results are displayed)  Labs Reviewed - No data to  display ____________________________________________  EKG   ____________________________________________  RADIOLOGY   ____________________________________________   PROCEDURES  Procedure(s) performed: No  Procedures   Critical Care performed: No ____________________________________________   INITIAL IMPRESSION / ASSESSMENT AND PLAN / ED COURSE  Pertinent labs & imaging results that were available during my care of the patient were reviewed by me and considered in my medical decision making (see chart for details).   Exam is consistent with mild cellulitis, will Rx Keflex, outpatient follow-up   ____________________________________________   FINAL CLINICAL IMPRESSION(S) / ED DIAGNOSES  Final diagnoses:  Cellulitis of left lower extremity      NEW MEDICATIONS STARTED DURING THIS VISIT:  Discharge Medication List as of 03/16/2021 12:36 PM     START taking these medications   Details  cephALEXin (KEFLEX) 500 MG capsule Take 1 capsule (500 mg total) by mouth 2 (two) times daily., Starting Mon 03/16/2021, Normal         Note:  This document was prepared using Dragon voice recognition software and may include unintentional dictation errors.    Jene Every, MD 03/16/21 1444

## 2022-02-03 IMAGING — CR DG LUMBAR SPINE 2-3V
1 series · 3 of 3 positions shown · non-contrast
Comparison: Thoracic radiographs 09/13/2012

CLINICAL DATA: Back pain worsening today, history of surgery for
scoliosis

EXAM:
LUMBAR SPINE - 2-3 VIEW

[Series 1: dg lumbar spine 2-3 views · 0.14mm/px · 3 of 3 slices shown]
[im 1/3]
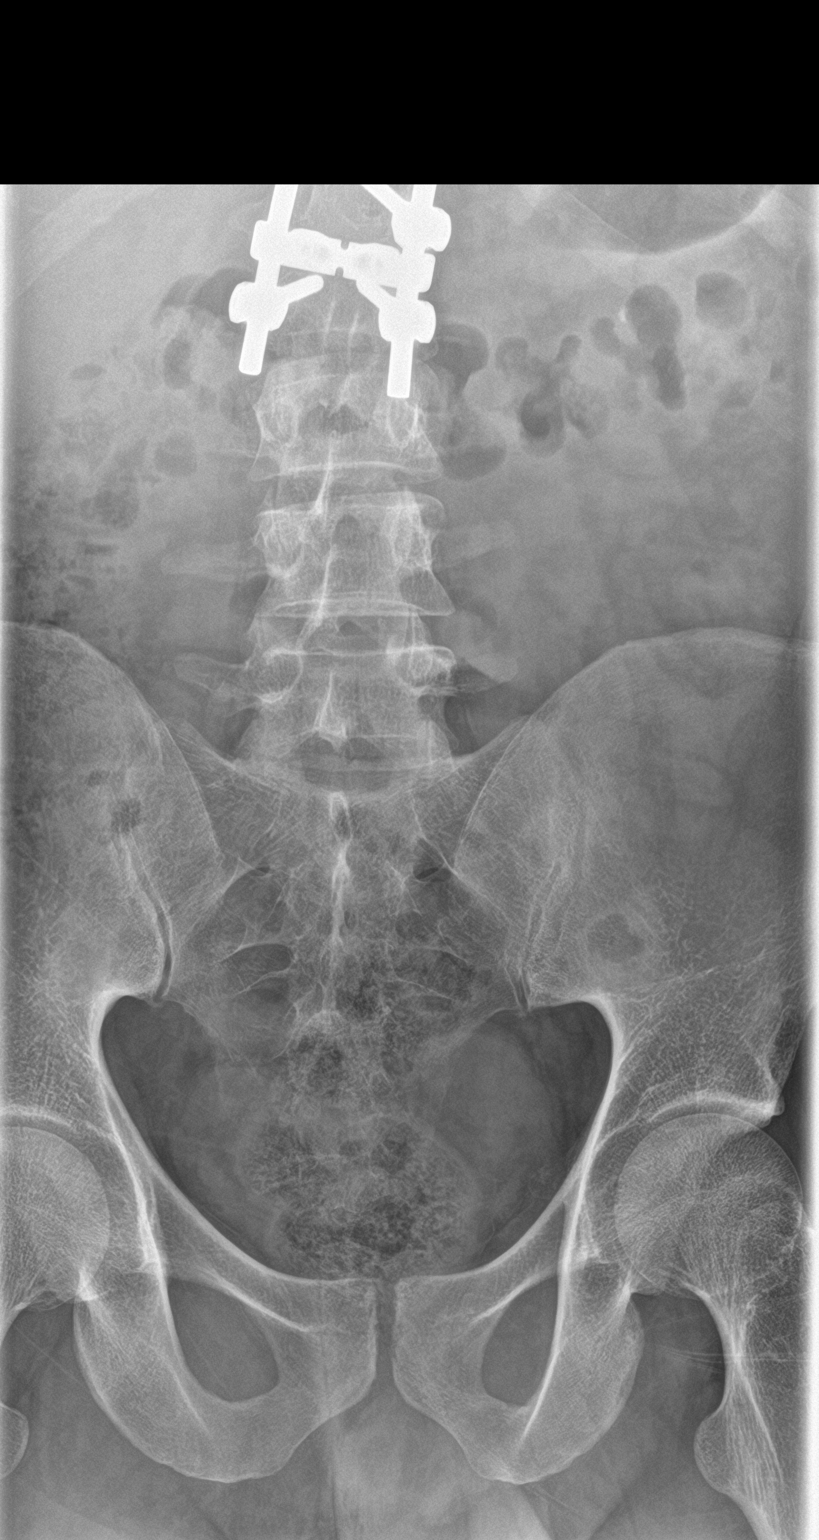
[im 2/3]
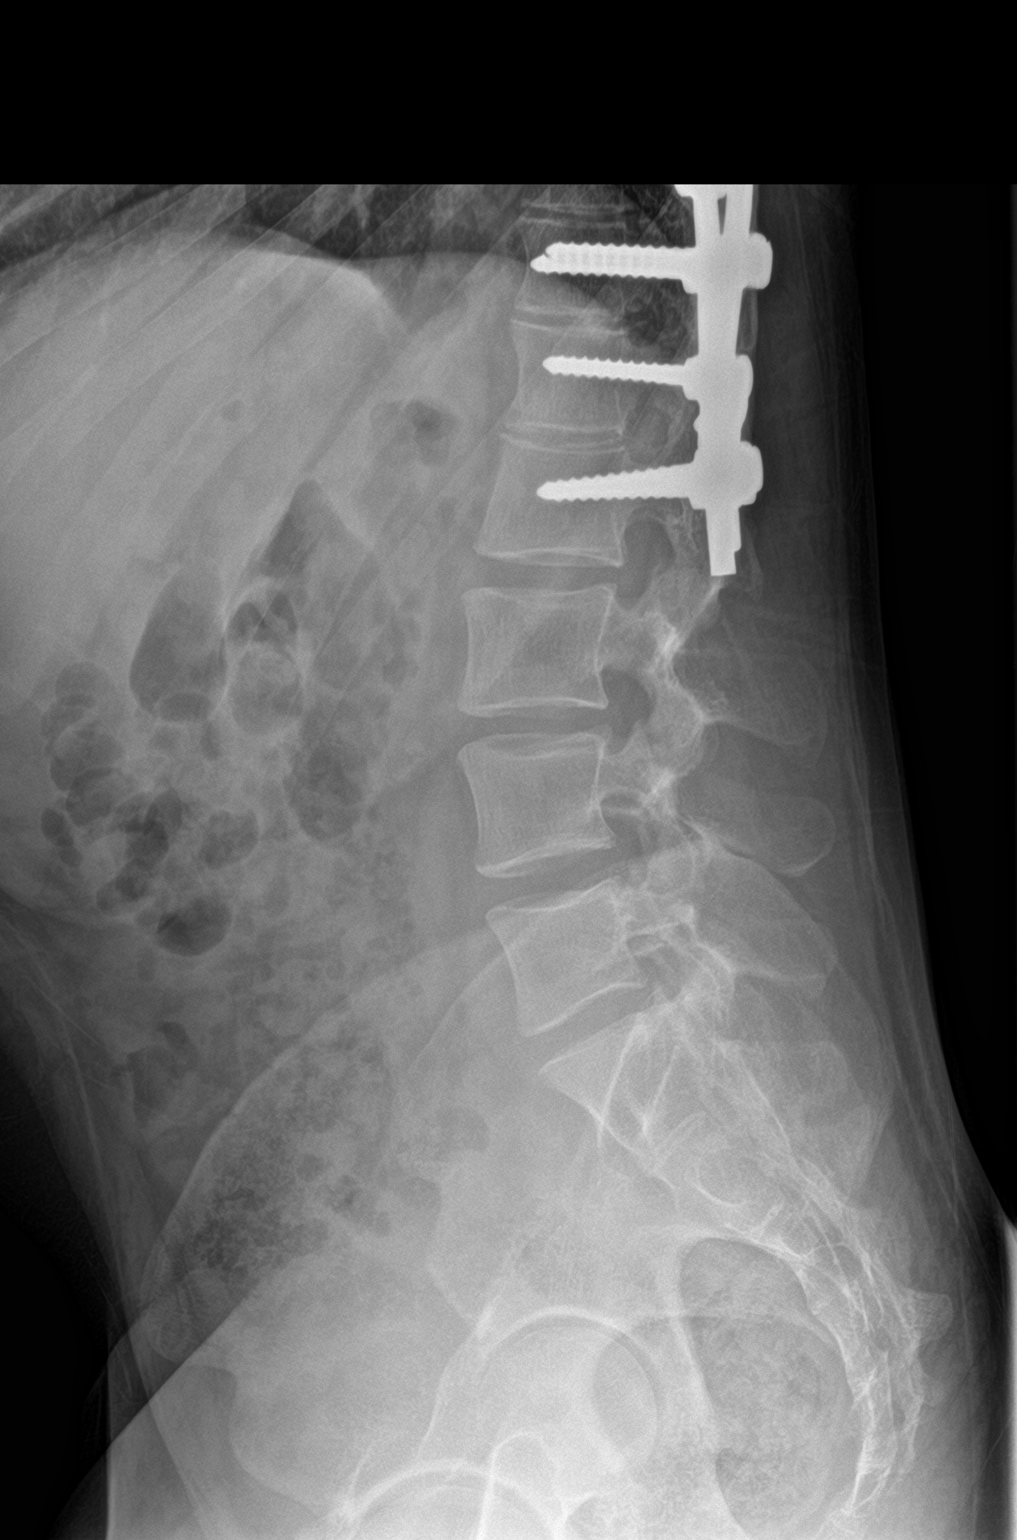
[im 3/3]
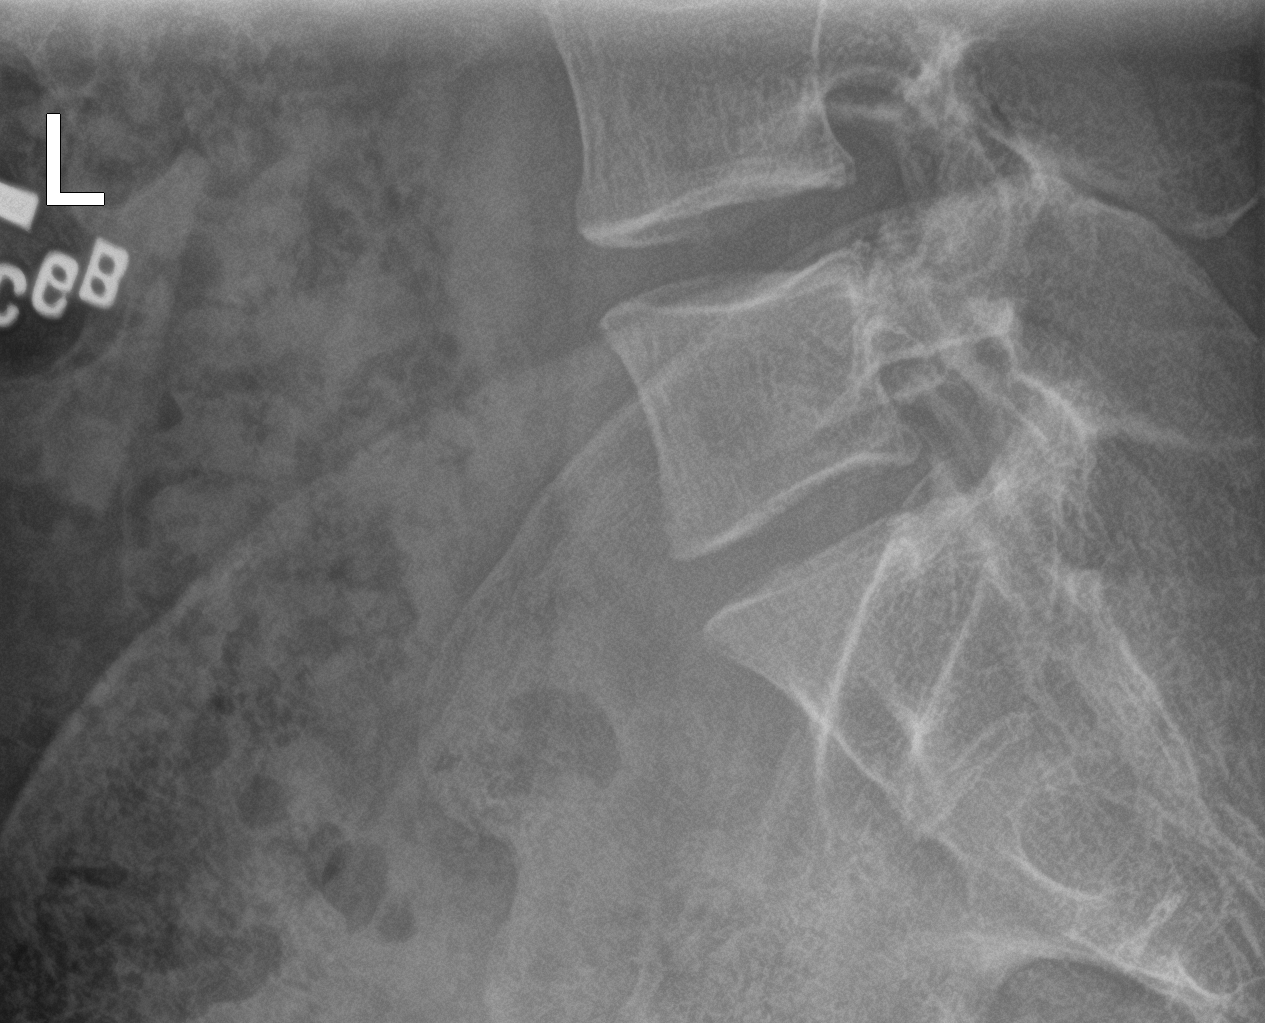

[3 of 3 positions shown; findings below may reference images not displayed]

FINDINGS: Long segment thoracolumbar fusion hardware incompletely included
within the level of imaging demonstrating fusion from the thoracic
levels through L2 including bilateral transpedicular screws at L2
and a left transpedicular screw L1. No evidence of acute hardware
complication or failure. Mild residual levocurvature of the thoracic
levels. No acute fracture or vertebral body height loss. Normal bone
mineralization. No worrisome osseous lesions. Included portions of
bony pelvis are free of acute abnormality.
IMPRESSION: 1. Long segment thoracolumbar fusion hardware without evidence of
acute hardware complication or failure.
2. Mild residual levocurvature of the thoracic levels.
3. No acute osseous abnormality.

## 2022-10-03 ENCOUNTER — Emergency Department: Payer: Self-pay

## 2022-10-03 ENCOUNTER — Other Ambulatory Visit: Payer: Self-pay

## 2022-10-03 DIAGNOSIS — N132 Hydronephrosis with renal and ureteral calculous obstruction: Secondary | ICD-10-CM | POA: Insufficient documentation

## 2022-10-03 LAB — CBC
HCT: 45.5 % (ref 39.0–52.0)
Hemoglobin: 15.2 g/dL (ref 13.0–17.0)
MCH: 29.3 pg (ref 26.0–34.0)
MCHC: 33.4 g/dL (ref 30.0–36.0)
MCV: 87.7 fL (ref 80.0–100.0)
Platelets: 340 10*3/uL (ref 150–400)
RBC: 5.19 MIL/uL (ref 4.22–5.81)
RDW: 12.5 % (ref 11.5–15.5)
WBC: 9.3 10*3/uL (ref 4.0–10.5)
nRBC: 0 % (ref 0.0–0.2)

## 2022-10-03 NOTE — ED Triage Notes (Signed)
L flank pain. Reports need to void but unable to do so. Last urination 1.5 hrs ago and that was a small amount and difficult to pass. Denies n/v/d. Denies fever. Pt alert and oriented following commands.

## 2022-10-04 ENCOUNTER — Emergency Department
Admission: EM | Admit: 2022-10-04 | Discharge: 2022-10-04 | Disposition: A | Discharge status: Home / Self Care | Payer: Self-pay | Attending: Emergency Medicine | Admitting: Emergency Medicine

## 2022-10-04 DIAGNOSIS — N200 Calculus of kidney: Secondary | ICD-10-CM

## 2022-10-04 LAB — URINALYSIS, ROUTINE W REFLEX MICROSCOPIC
Bacteria, UA: NONE SEEN
Bilirubin Urine: NEGATIVE
Glucose, UA: NEGATIVE mg/dL
Ketones, ur: NEGATIVE mg/dL
Leukocytes,Ua: NEGATIVE
Nitrite: NEGATIVE
Protein, ur: 30 mg/dL — AB
RBC / HPF: >50 RBC/hpf — ABNORMAL HIGH (ref 0–5)
Specific Gravity, Urine: 1.029 (ref 1.005–1.030)
Squamous Epithelial / HPF: NONE SEEN /HPF (ref 0–5)
WBC, UA: NONE SEEN WBC/hpf (ref 0–5)
pH: 5 (ref 5.0–8.0)

## 2022-10-04 LAB — BASIC METABOLIC PANEL
Anion gap: 10 (ref 5–15)
BUN: 15 mg/dL (ref 6–20)
CO2: 26 mmol/L (ref 22–32)
Calcium: 9 mg/dL (ref 8.9–10.3)
Chloride: 100 mmol/L (ref 98–111)
Creatinine, Ser: 0.64 mg/dL (ref 0.61–1.24)
GFR, Estimated: >60 mL/min (ref >60)
Glucose, Bld: 96 mg/dL (ref 70–99)
Potassium: 3.8 mmol/L (ref 3.5–5.1)
Sodium: 136 mmol/L (ref 135–145)

## 2022-10-04 MED ORDER — ONDANSETRON 4 MG PO TBDP: 4.0000 mg | ORAL_TABLET | Freq: Three times a day (TID) | ORAL | 0 refills | Status: AC | PRN

## 2022-10-04 MED ORDER — KETOROLAC TROMETHAMINE 15 MG/ML IJ SOLN
15.0000 mg | Freq: Once | INTRAMUSCULAR | Status: AC
  Administered 2022-10-04: 15 mg via INTRAVENOUS
  Filled 2022-10-04: qty 1

## 2022-10-04 MED ORDER — OXYCODONE HCL 5 MG PO TABS
5.0000 mg | ORAL_TABLET | Freq: Once | ORAL | Status: AC
  Administered 2022-10-04: 5 mg via ORAL
  Filled 2022-10-04: qty 1

## 2022-10-04 MED ORDER — ONDANSETRON HCL 4 MG/2ML IJ SOLN
4.0000 mg | Freq: Once | INTRAMUSCULAR | Status: AC
  Administered 2022-10-04: 4 mg via INTRAVENOUS
  Filled 2022-10-04: qty 2

## 2022-10-04 MED ORDER — TAMSULOSIN HCL 0.4 MG PO CAPS: 0.4000 mg | ORAL_CAPSULE | Freq: Every day | ORAL | 0 refills | Status: AC

## 2022-10-04 MED ORDER — IBUPROFEN 600 MG PO TABS: 600.0000 mg | ORAL_TABLET | Freq: Four times a day (QID) | ORAL | 0 refills | Status: AC | PRN

## 2022-10-04 MED ORDER — OXYCODONE HCL 5 MG PO TABS: 5.0000 mg | ORAL_TABLET | Freq: Four times a day (QID) | ORAL | 0 refills | Status: AC | PRN

## 2022-10-04 MED ORDER — SODIUM CHLORIDE 0.9 % IV BOLUS
1000.0000 mL | Freq: Once | INTRAVENOUS | Status: AC
  Administered 2022-10-04: 1000 mL via INTRAVENOUS

## 2022-10-04 NOTE — ED Provider Notes (Signed)
[facility] Provider Note    Event Date/Time   First MD Initiated Contact with Patient 10/04/22 [phone]     (approximate)   History   Flank Pain (/)   HPI  KRUZ CHIU is a 32 y.o. male who comes in with left flank pain.  Patient reports difficult to pass his urine.  Denies any fevers.  He denies any history of kidney stones.  He reports that the pain started today this was associated with an episode of vomiting when he tried to take Tylenol.  He reports pain on the left side radiating into his groin.   Physical Exam   Triage Vital Signs: ED Triage Vitals  Enc Vitals Group     BP 10/03/22 2326 121/73     Pulse Rate 10/03/22 2326 86     Resp 10/03/22 2326 18     Temp 10/03/22 2326 98.8 F (37.1 C)     Temp Source 10/03/22 2326 Oral     SpO2 10/03/22 2326 99 %     Weight 10/03/22 2326 130 lb (59 kg)     Height 10/03/22 2326 5' (1.524 m)     Head Circumference --      Peak Flow --      Pain Score 10/03/22 2329 8     Pain Loc --      Pain Edu? --      Excl. in [place]? --     Most recent vital signs: Vitals:   10/03/22 2326  BP: 121/73  Pulse: 86  Resp: 18  Temp: 98.8 F (37.1 C)  SpO2: 99%     General: Awake, no distress.  CV:  Good peripheral perfusion.  Resp:  Normal effort.  Abd:  No distention.  Soft and nontender.  Left flank pain Other:     ED Results / Procedures / Treatments   Labs (all labs ordered are listed, but only abnormal results are displayed) Labs Reviewed  BASIC METABOLIC PANEL  CBC  URINALYSIS, ROUTINE W REFLEX MICROSCOPIC      RADIOLOGY I have reviewed the CT personally interpreted and patient has a left-sided kidney stone.  The bladder does not look distended.   PROCEDURES:  Critical Care performed: No  Procedures   MEDICATIONS ORDERED IN ED: Medications  ondansetron (ZOFRAN) injection 4 mg (4 mg Intravenous Given 10/04/22 0132)  ketorolac (TORADOL) 15 MG/ML injection 15 mg (15 mg Intravenous  Given 10/04/22 0132)  sodium chloride 0.9 % bolus 1,000 mL (1,000 mLs Intravenous New Bag/Given 10/04/22 0132)  oxyCODONE (Oxy IR/ROXICODONE) immediate release tablet 5 mg (5 mg Oral Given 10/04/22 0132)     IMPRESSION / MDM / ASSESSMENT AND PLAN / ED COURSE  I reviewed the triage vital signs and the nursing notes.   Patient's presentation is most consistent with acute presentation with potential threat to life or bodily function.   Differential is kidney stone, UTI, pyelonephritis.  Vitals are stable.  CBC shows normal white count.  BMP shows normal kidney function.  UA with significant RBCs  IMPRESSION: Obstructive 4 mm left ureteropelvic junction stone.   Patient given Toradol, Zofran, fluids, oxycodone to help with pain.  Patient denies any alcohol or drug use.  We discussed treatment with Tylenol, ibuprofen, oxycodone for breakthrough pain and no driving Flomax, Zofran.  He understands return if he develops any fevers and to follow-up with urology outpatient as needed.  The patient is on the cardiac monitor to evaluate for evidence of arrhythmia and/or  significant heart rate changes.      FINAL CLINICAL IMPRESSION(S) / ED DIAGNOSES   Final diagnoses:  Kidney stone     Rx / DC Orders   ED Discharge Orders          Ordered    tamsulosin (FLOMAX) 0.4 MG CAPS capsule  Daily        10/04/22 0244    ibuprofen (ADVIL) 600 MG tablet  Every 6 hours PRN        10/04/22 0244    oxyCODONE (ROXICODONE) 5 MG immediate release tablet  Every 6 hours PRN        10/04/22 0244    ondansetron (ZOFRAN-ODT) 4 MG disintegrating tablet  Every 8 hours PRN        10/04/22 0244             Note:  This document was prepared using Dragon voice recognition software and may include unintentional dictation errors.   Concha Se, MD 10/04/22 [phone]

## 2022-10-04 NOTE — Discharge Instructions (Addendum)
IMPRESSION: Obstructive 4 mm left ureteropelvic junction stone.  You have a kidney stone. See report  Take ibuprofen 600mg  every 8 hours daily (as long as you are not on any other blood thinners or have kidney disease) Take tylenol 1g every 8 hours daily. Take oxycodone for breakthrough pain. Do not drive, work, or operate machinery while on this.  Take zofran to help with nausea. Take Flomax to help dilate uretha. Call urology number above to schedule outpatient appointment. Return to ED for fevers, unable to keep food down, or any other concerns.     Take oxycodone as prescribed. Do not drink alcohol, drive or participate in any other potentially dangerous activities while taking this medication as it may make you sleepy. Do not take this medication with any other sedating medications, either prescription or over-the-counter. If you were prescribed Percocet or Vicodin, do not take these with acetaminophen (Tylenol) as it is already contained within these medications.  This medication is an opiate (or narcotic) pain medication and can be habit forming. Use it as little as possible to achieve adequate pain control. Do not use or use it with extreme caution if you have a history of opiate abuse or dependence. If you are on a pain contract with your primary care doctor or a pain specialist, be sure to let them know you were prescribed this medication today from the Emergency Department. This medication is intended for your use only - do not give any to anyone else and keep it in a secure place where nobody else, especially children, have access to it.

## 2023-10-09 ENCOUNTER — Emergency Department
Admission: EM | Admit: 2023-10-09 | Discharge: 2023-10-09 | Disposition: A | Discharge status: Home / Self Care | Payer: No Typology Code available for payment source | Attending: Emergency Medicine | Admitting: Emergency Medicine

## 2023-10-09 ENCOUNTER — Emergency Department: Payer: No Typology Code available for payment source

## 2023-10-09 DIAGNOSIS — S20212A Contusion of left front wall of thorax, initial encounter: Secondary | ICD-10-CM | POA: Insufficient documentation

## 2023-10-09 DIAGNOSIS — Y92410 Unspecified street and highway as the place of occurrence of the external cause: Secondary | ICD-10-CM | POA: Diagnosis not present

## 2023-10-09 DIAGNOSIS — M545 Low back pain, unspecified: Secondary | ICD-10-CM | POA: Diagnosis not present

## 2023-10-09 DIAGNOSIS — S299XXA Unspecified injury of thorax, initial encounter: Secondary | ICD-10-CM | POA: Diagnosis present
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
## 2023-10-09 NOTE — Discharge Instructions (Signed)
 Your xrays were normal. Please return to the emergency department for any new, worsening, or changing symptoms or other concerns including weakness in your legs, urinary or stool incontinence or retention, numbness or tingling in your extremities/buttocks/groin, fevers, or any other concerns or change in symptoms.
